# Patient Record
Sex: Female | Born: 1965 | Race: White | Hispanic: No | Smoking: Never smoker
Health system: Southern US, Community
[De-identification: ages and names within clinical notes are randomized; demographics above are authoritative.]

## PROBLEM LIST (undated history)

## (undated) DIAGNOSIS — M329 Systemic lupus erythematosus, unspecified: Secondary | ICD-10-CM

## (undated) DIAGNOSIS — M797 Fibromyalgia: Secondary | ICD-10-CM

## (undated) HISTORY — DX: Fibromyalgia: M79.7

## (undated) HISTORY — PX: ROUX-EN-Y GASTRIC BYPASS: SHX1104

## (undated) HISTORY — PX: NECK SURGERY: SHX720

## (undated) HISTORY — PX: CHOLECYSTECTOMY: SHX55

## (undated) HISTORY — DX: Systemic lupus erythematosus, unspecified: M32.9

## (undated) HISTORY — PX: ABDOMINAL HYSTERECTOMY: SHX81

## (undated) HISTORY — PX: APPENDECTOMY: SHX54

## (undated) HISTORY — PX: BACK SURGERY: SHX140

## (undated) HISTORY — PX: ANKLE SURGERY: SHX546

---

## 2001-11-24 ENCOUNTER — Encounter: Payer: Self-pay | Admitting: Emergency Medicine

## 2001-11-24 ENCOUNTER — Emergency Department (HOSPITAL_COMMUNITY): Admission: EM | Admit: 2001-11-24 | Discharge: 2001-11-24 | Payer: Self-pay | Admitting: Emergency Medicine

## 2004-10-28 ENCOUNTER — Emergency Department: Payer: Self-pay | Admitting: Emergency Medicine

## 2004-11-17 ENCOUNTER — Emergency Department: Payer: Self-pay | Admitting: Emergency Medicine

## 2005-02-14 ENCOUNTER — Emergency Department: Payer: Self-pay | Admitting: Emergency Medicine

## 2006-05-21 ENCOUNTER — Other Ambulatory Visit: Payer: Self-pay

## 2006-05-21 ENCOUNTER — Emergency Department: Payer: Self-pay | Admitting: Emergency Medicine

## 2006-05-26 ENCOUNTER — Emergency Department: Payer: Self-pay | Admitting: Emergency Medicine

## 2006-07-28 ENCOUNTER — Emergency Department: Payer: Self-pay | Admitting: Internal Medicine

## 2007-02-02 ENCOUNTER — Other Ambulatory Visit: Payer: Self-pay

## 2007-02-02 ENCOUNTER — Emergency Department: Payer: Self-pay | Admitting: Emergency Medicine

## 2007-08-17 ENCOUNTER — Emergency Department: Payer: Self-pay | Admitting: Emergency Medicine

## 2008-08-16 ENCOUNTER — Emergency Department: Payer: Self-pay | Admitting: Emergency Medicine

## 2008-12-26 ENCOUNTER — Emergency Department (HOSPITAL_COMMUNITY): Admission: EM | Admit: 2008-12-26 | Discharge: 2008-12-26 | Payer: Self-pay | Admitting: Emergency Medicine

## 2009-03-28 ENCOUNTER — Emergency Department (HOSPITAL_COMMUNITY): Admission: EM | Admit: 2009-03-28 | Discharge: 2009-03-28 | Payer: Self-pay | Admitting: Emergency Medicine

## 2009-03-31 ENCOUNTER — Emergency Department (HOSPITAL_COMMUNITY): Admission: EM | Admit: 2009-03-31 | Discharge: 2009-03-31 | Payer: Self-pay | Admitting: *Deleted

## 2009-06-27 ENCOUNTER — Emergency Department (HOSPITAL_COMMUNITY): Admission: EM | Admit: 2009-06-27 | Discharge: 2009-06-27 | Payer: Self-pay | Admitting: Emergency Medicine

## 2009-08-30 ENCOUNTER — Emergency Department (HOSPITAL_COMMUNITY): Admission: EM | Admit: 2009-08-30 | Discharge: 2009-08-31 | Payer: Self-pay | Admitting: Emergency Medicine

## 2009-09-16 ENCOUNTER — Emergency Department: Payer: Self-pay | Admitting: Emergency Medicine

## 2009-09-23 ENCOUNTER — Emergency Department: Payer: Self-pay | Admitting: Emergency Medicine

## 2009-10-02 ENCOUNTER — Emergency Department (HOSPITAL_COMMUNITY): Admission: EM | Admit: 2009-10-02 | Discharge: 2009-10-02 | Payer: Self-pay | Admitting: Emergency Medicine

## 2010-01-09 ENCOUNTER — Emergency Department: Payer: Self-pay | Admitting: Internal Medicine

## 2010-02-15 ENCOUNTER — Inpatient Hospital Stay (HOSPITAL_COMMUNITY): Admission: EM | Admit: 2010-02-15 | Discharge: 2010-02-19 | Payer: Self-pay | Admitting: Emergency Medicine

## 2010-04-05 ENCOUNTER — Emergency Department (HOSPITAL_COMMUNITY)
Admission: EM | Admit: 2010-04-05 | Discharge: 2010-04-05 | Payer: Self-pay | Source: Home / Self Care | Admitting: Emergency Medicine

## 2010-04-29 IMAGING — CR DG ANKLE COMPLETE 3+V*L*
3 series · 3 of 3 positions shown · non-contrast
Comparison: None

CLINICAL DATA: Fall, pain.

LEFT ANKLE COMPLETE - 3+ VIEW

[t ankle joint ap left]
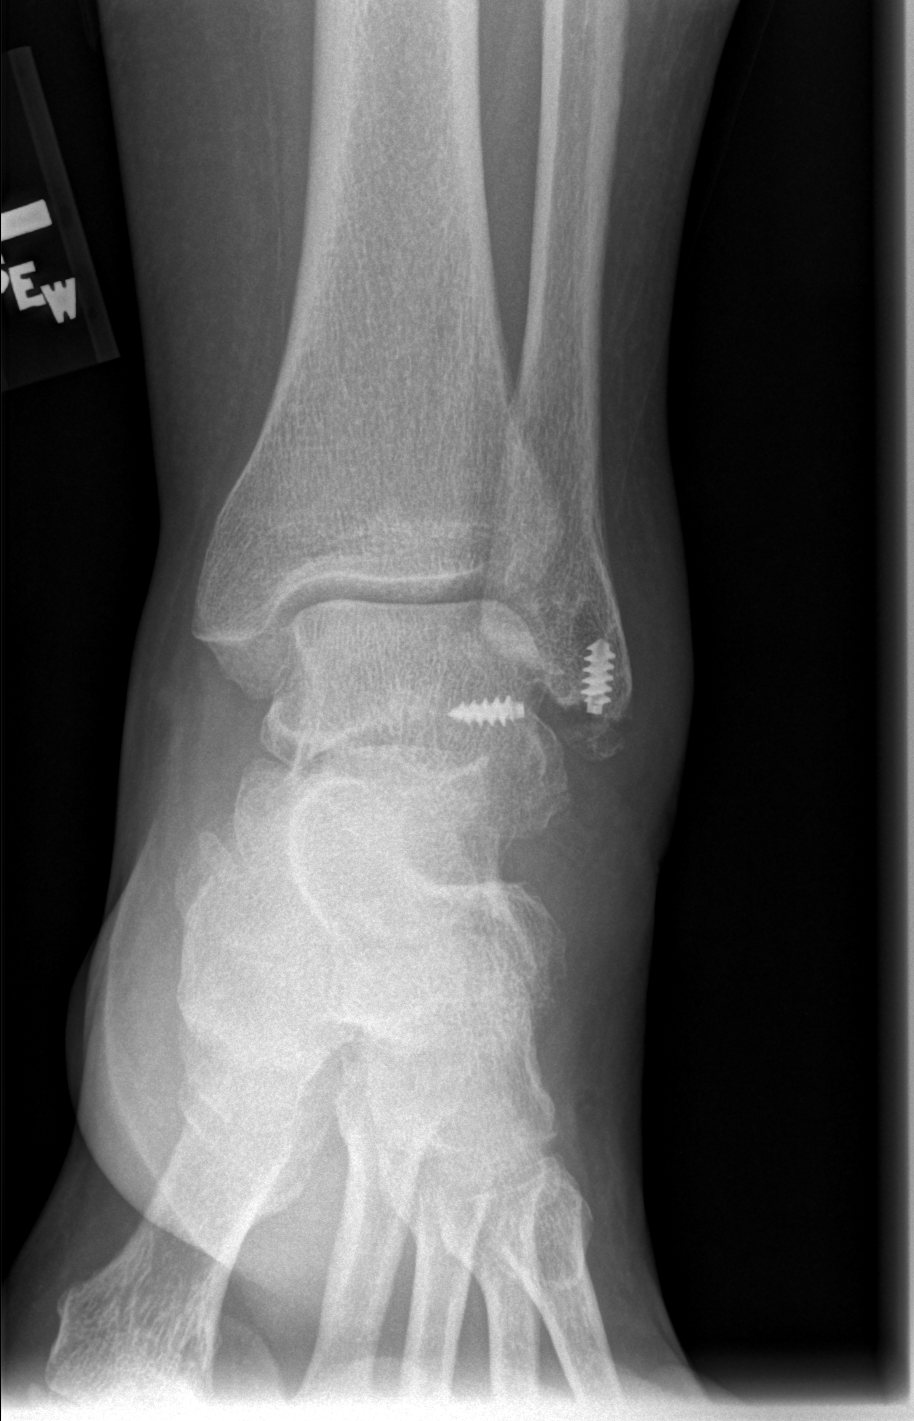

[t ankle joint oblique left]
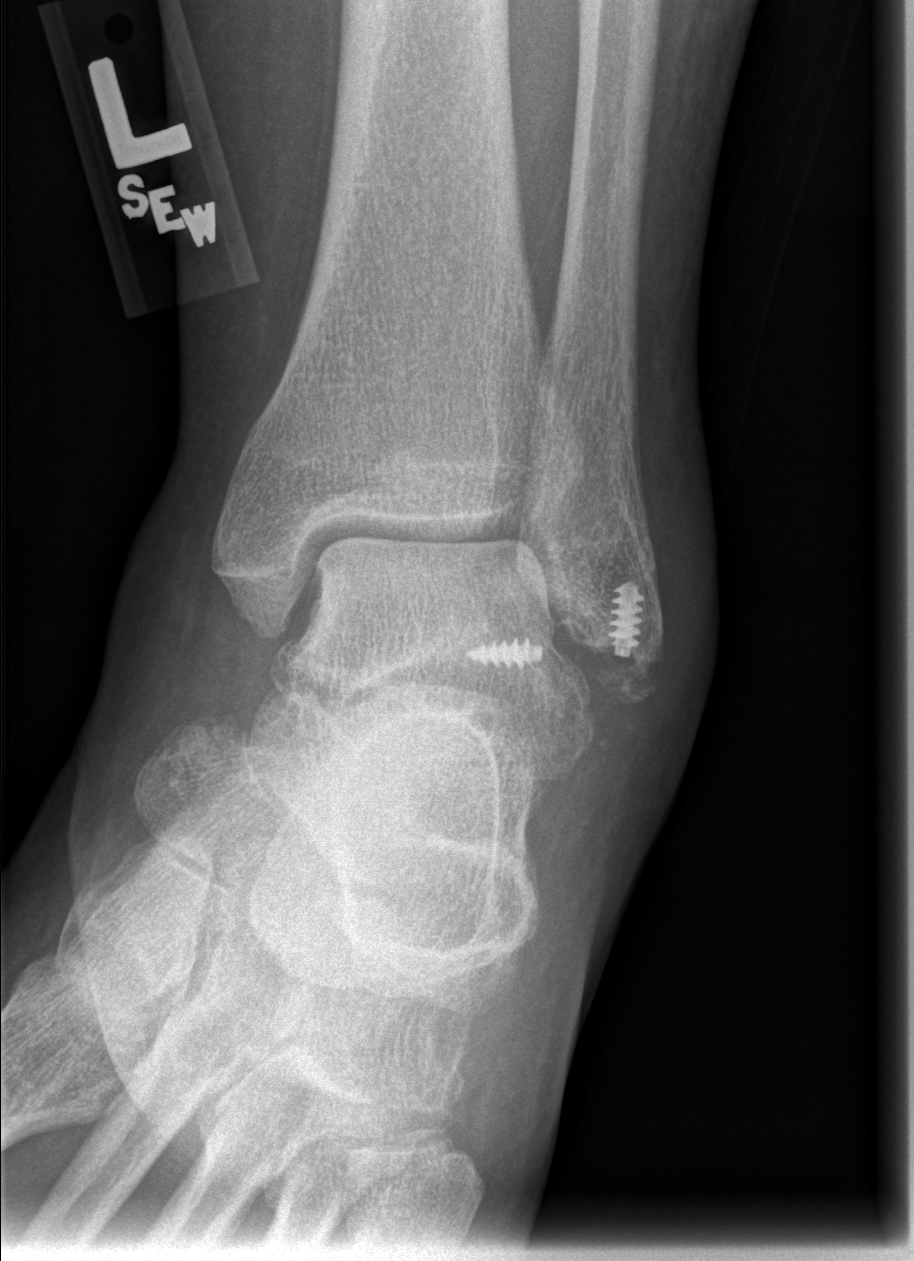

[t ankle joint lat left]
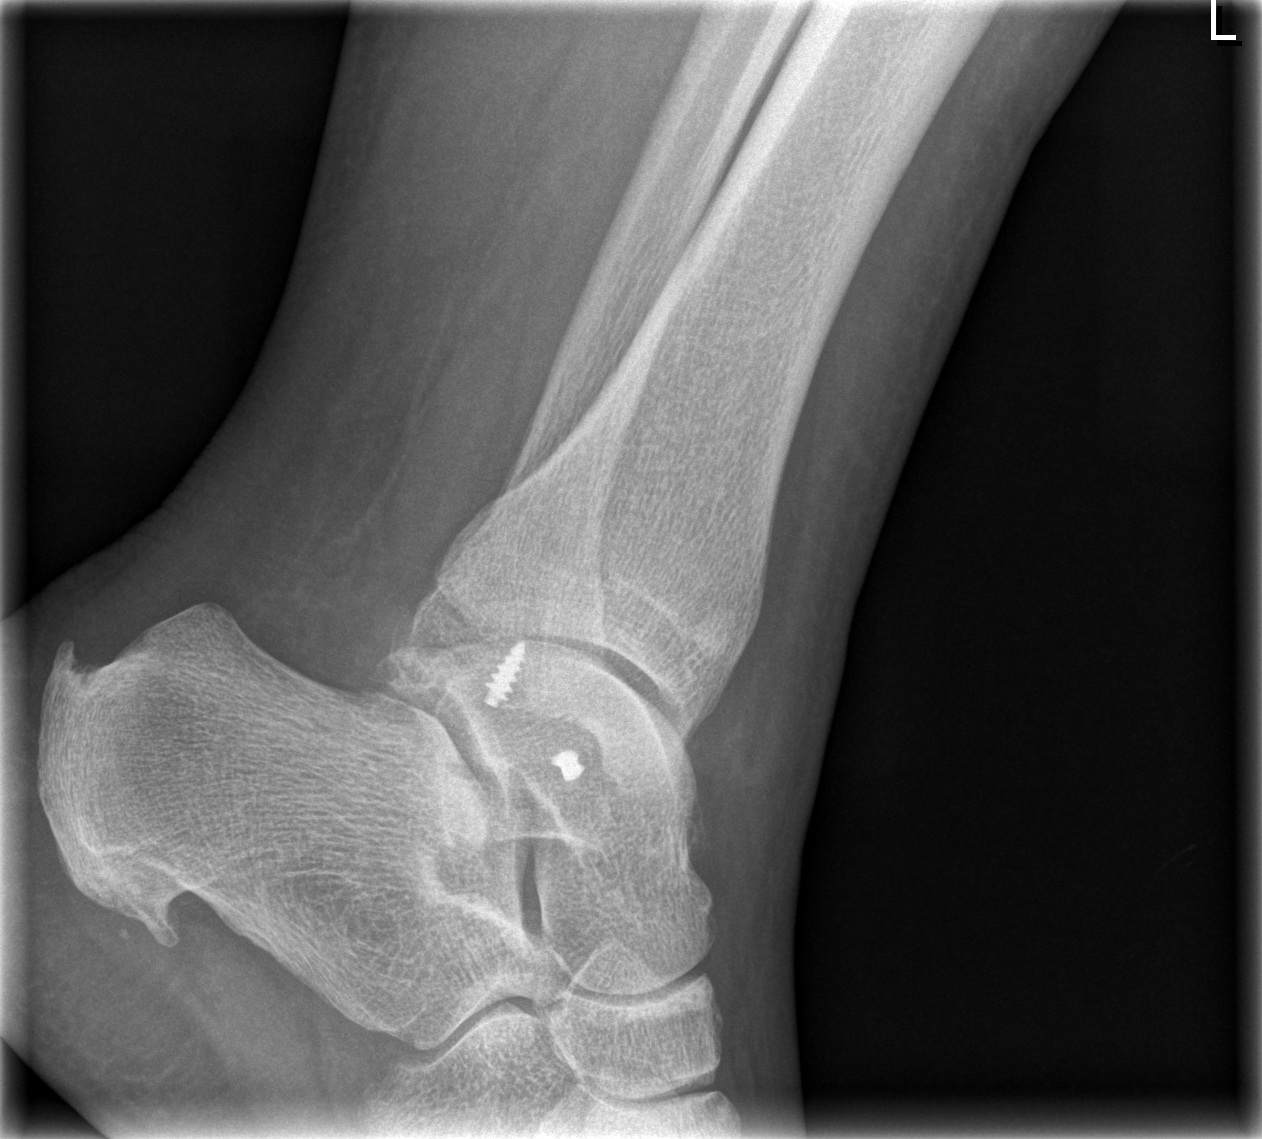

[3 of 3 positions shown; findings below may reference images not displayed]

FINDINGS: Postoperative changes noted in the region of the lateral
malleolus and lateral talus.  Lateral soft tissue swelling noted.
Bone fragments along the tip of the lateral malleolus are noted.
It is difficult to know if this is related to old injury or acute
avulsion.
IMPRESSION: Bone fragments along the tip of the lateral malleolus with
postoperative changes.  I favor this is related to old injury.  It
is difficult to completely exclude acute avulsion off the tip of
the lateral malleolus.  Recommend clinical correlation.

## 2010-05-14 IMAGING — CR DG KNEE COMPLETE 4+V*L*
4 series · 4 of 4 positions shown · non-contrast
Comparison: None

CLINICAL DATA: Fall, knee pain.

LEFT KNEE - COMPLETE 4+ VIEW

[t knee ap right]
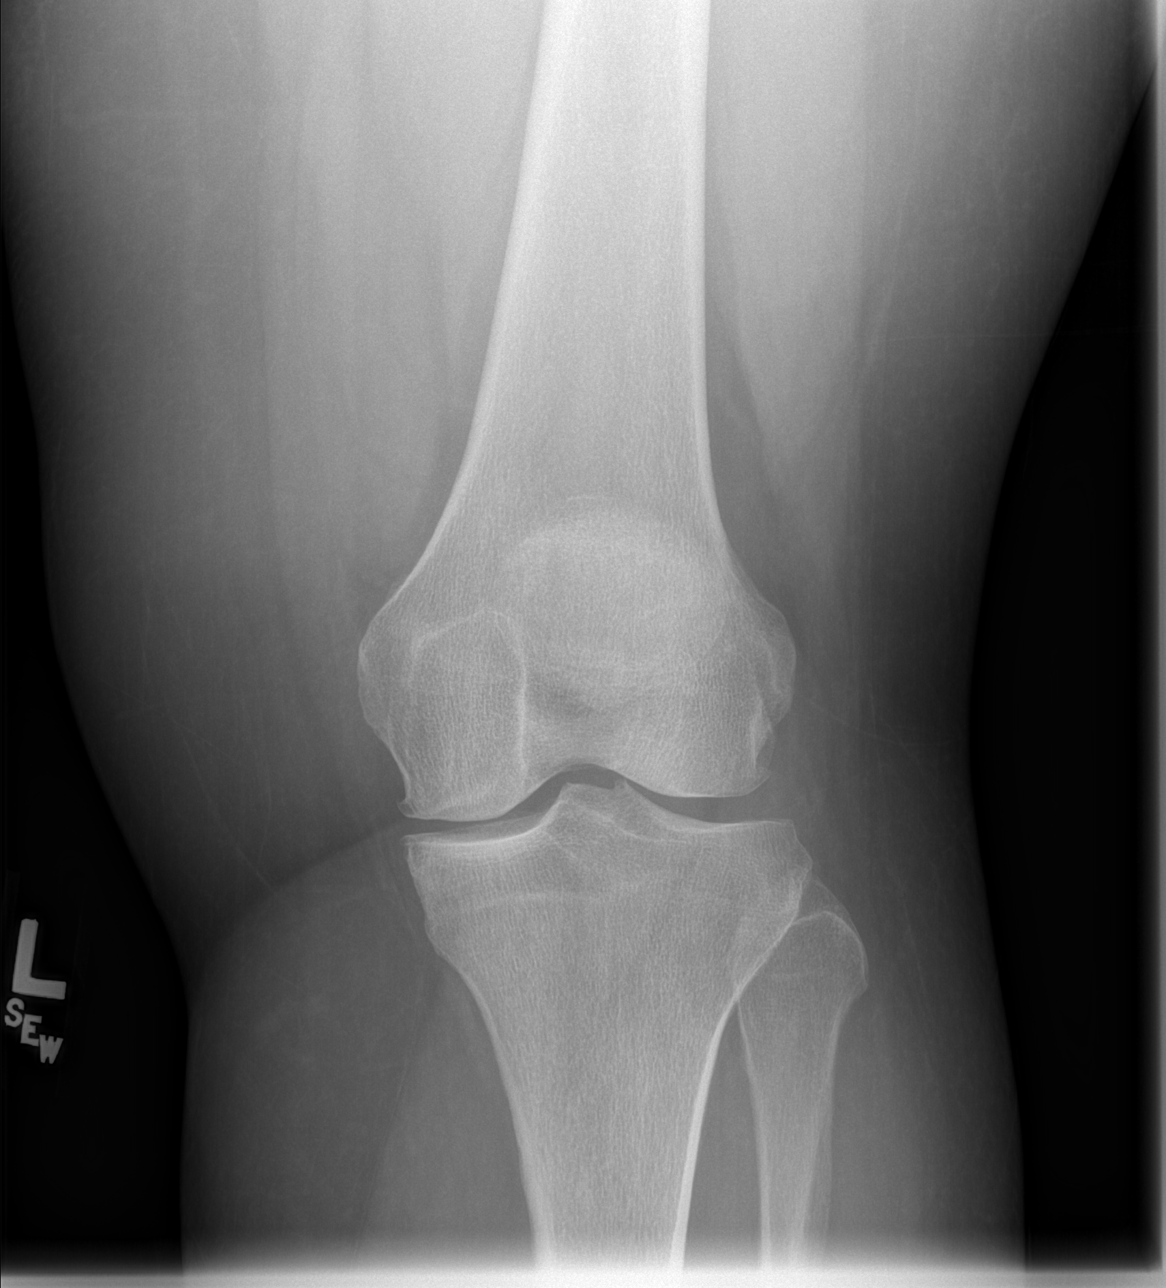

[t knee oblique right (1 of 2)]
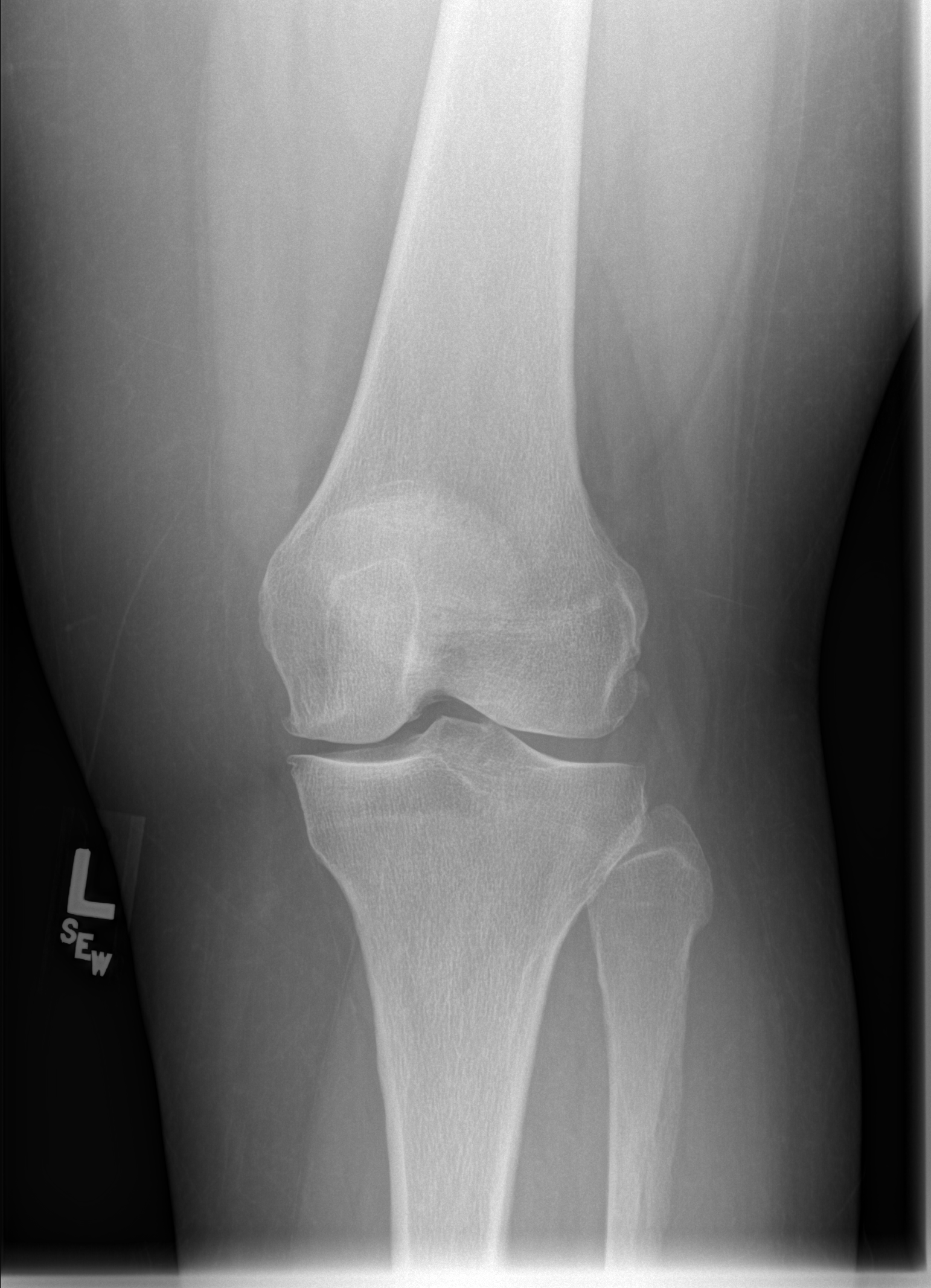

[t knee oblique right (2 of 2)]
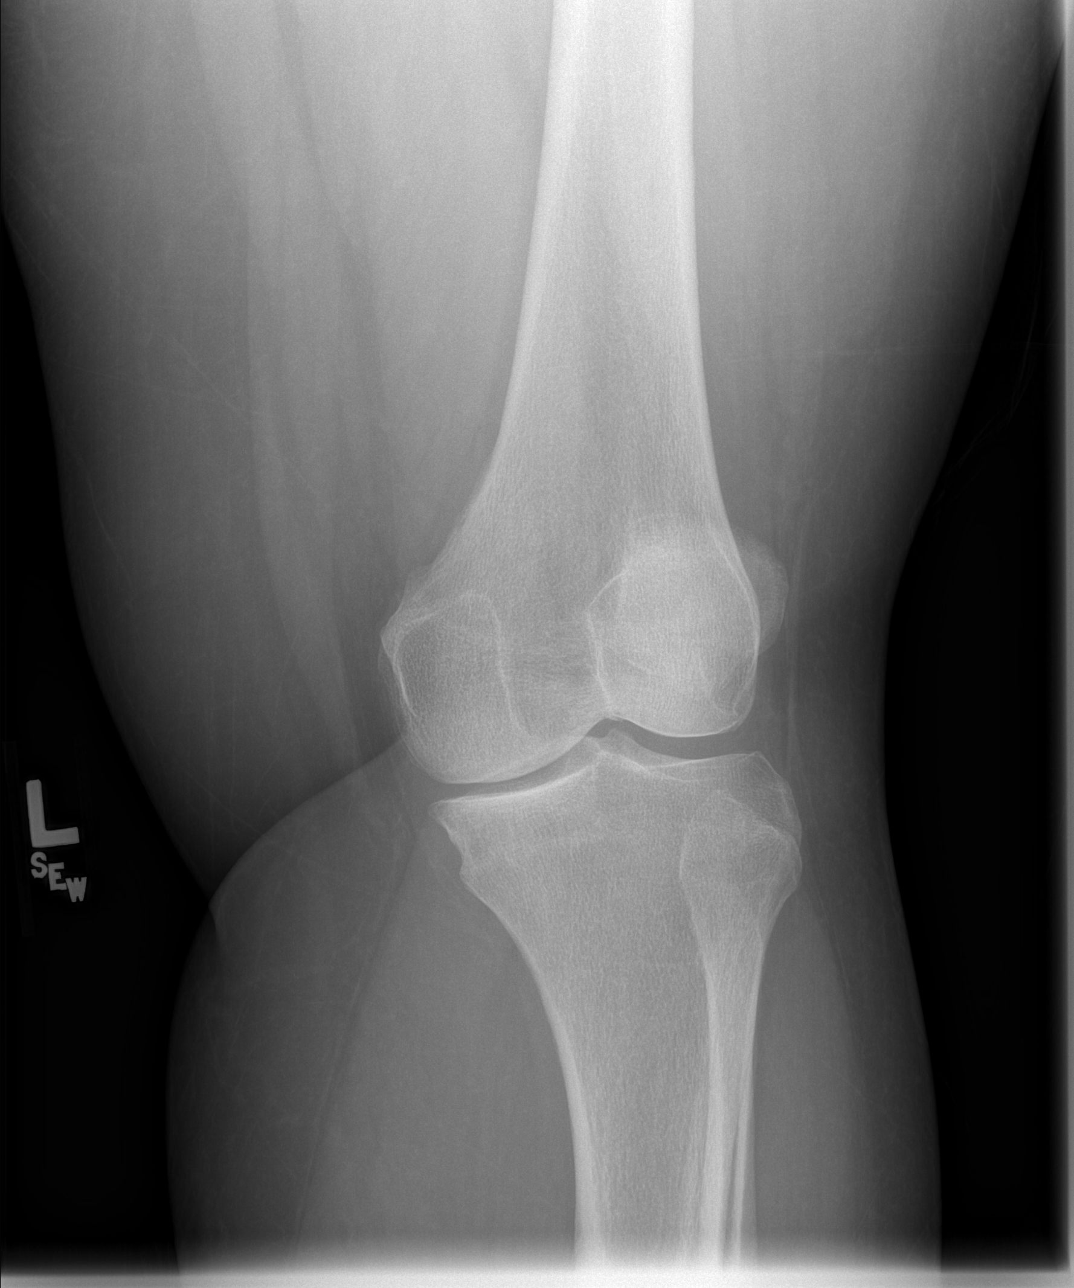

[t knee lat right]
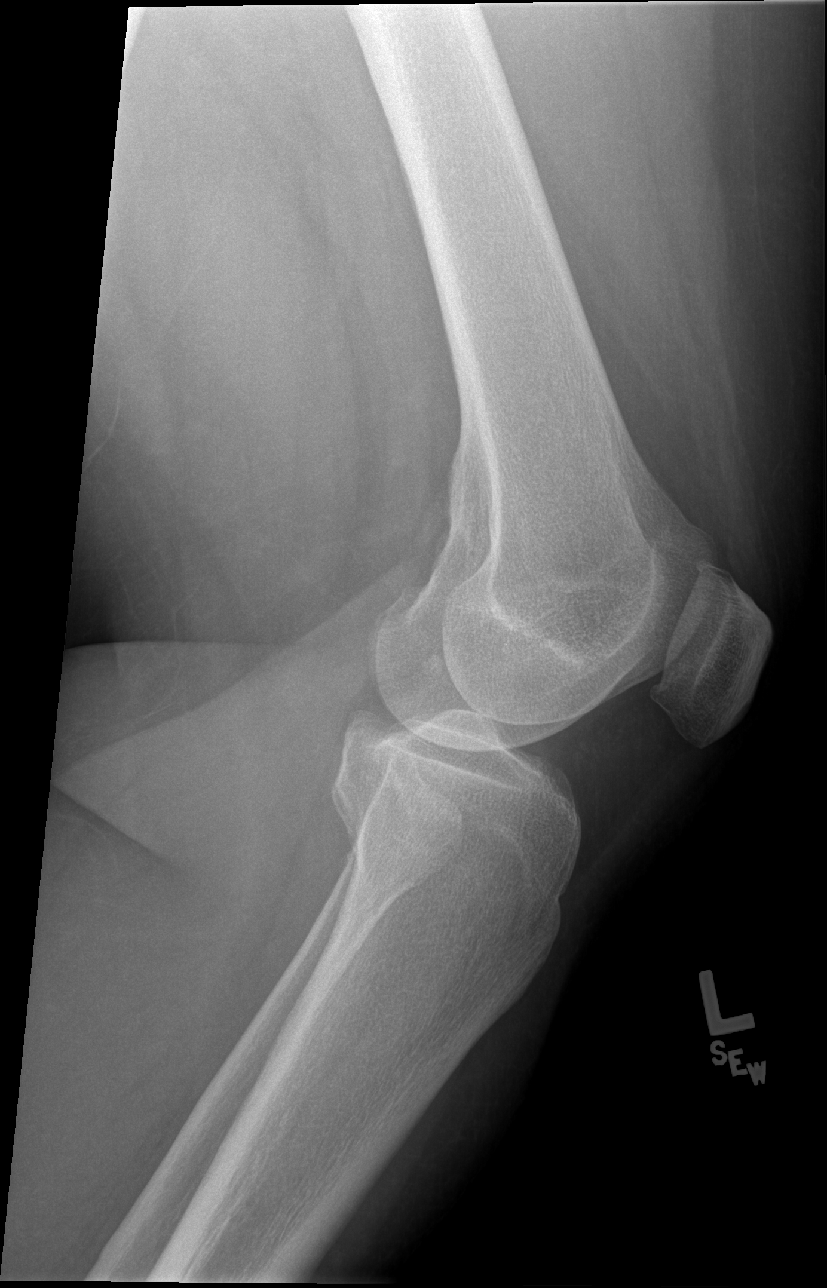

[4 of 4 positions shown; findings below may reference images not displayed]

FINDINGS: Mild degenerative changes in the left knee. No acute bony
abnormality.  Specifically, no fracture, subluxation, or
dislocation.  Soft tissues are intact. No joint effusion.
IMPRESSION: No acute bony abnormality.

## 2010-07-17 ENCOUNTER — Encounter (INDEPENDENT_AMBULATORY_CARE_PROVIDER_SITE_OTHER): Payer: Self-pay | Admitting: Internal Medicine

## 2010-11-11 ENCOUNTER — Inpatient Hospital Stay (HOSPITAL_COMMUNITY): Admission: EM | Admit: 2010-11-11 | Discharge: 2010-07-17 | Payer: Self-pay | Admitting: Emergency Medicine

## 2011-02-18 LAB — DIFFERENTIAL
Basophils Absolute: 0 10*3/uL (ref 0.0–0.1)
Eosinophils Absolute: 0.2 10*3/uL (ref 0.0–0.7)
Eosinophils Relative: 2 % (ref 0–5)
Lymphs Abs: 4 10*3/uL (ref 0.7–4.0)
Monocytes Relative: 6 % (ref 3–12)
Neutrophils Relative %: 63 % (ref 43–77)

## 2011-02-18 LAB — CK TOTAL AND CKMB (NOT AT ARMC)
CK, MB: 0.9 ng/mL (ref 0.3–4.0)
CK, MB: 1.1 ng/mL (ref 0.3–4.0)
Relative Index: 1 (ref 0.0–2.5)
Relative Index: INVALID (ref 0.0–2.5)
Total CK: 107 U/L (ref 7–177)

## 2011-02-18 LAB — GLUCOSE, CAPILLARY
Glucose-Capillary: 91 mg/dL (ref 70–99)
Glucose-Capillary: 93 mg/dL (ref 70–99)

## 2011-02-18 LAB — CBC
HCT: 39.1 % (ref 36.0–46.0)
Hemoglobin: 13 g/dL (ref 12.0–15.0)
MCV: 87.5 fL (ref 78.0–100.0)
Platelets: 317 10*3/uL (ref 150–400)
RBC: 4.47 MIL/uL (ref 3.87–5.11)
RDW: 14.9 % (ref 11.5–15.5)

## 2011-02-18 LAB — CARDIAC PANEL(CRET KIN+CKTOT+MB+TROPI)
CK, MB: 0.7 ng/mL (ref 0.3–4.0)
CK, MB: 0.8 ng/mL (ref 0.3–4.0)
Relative Index: INVALID (ref 0.0–2.5)
Total CK: 80 U/L (ref 7–177)
Troponin I: <0.01 ng/mL (ref 0.00–0.06)

## 2011-02-18 LAB — TROPONIN I: Troponin I: <0.01 ng/mL (ref 0.00–0.06)

## 2011-02-18 LAB — BASIC METABOLIC PANEL
Calcium: 8.8 mg/dL (ref 8.4–10.5)
Potassium: 4 mEq/L (ref 3.5–5.1)

## 2011-02-18 LAB — APTT: aPTT: 26 seconds (ref 24–37)

## 2011-02-22 LAB — TSH: TSH: 1.188 u[IU]/mL (ref 0.350–4.500)

## 2011-02-22 LAB — POCT I-STAT, CHEM 8
Chloride: 103 mEq/L (ref 96–112)
HCT: 43 % (ref 36.0–46.0)
Hemoglobin: 14.6 g/dL (ref 12.0–15.0)
Potassium: 4.3 mEq/L (ref 3.5–5.1)

## 2011-02-28 LAB — CBC
HCT: 36.2 % (ref 36.0–46.0)
Hemoglobin: 11.6 g/dL — ABNORMAL LOW (ref 12.0–15.0)
Hemoglobin: 12 g/dL (ref 12.0–15.0)
Hemoglobin: 12.7 g/dL (ref 12.0–15.0)
MCHC: 32.5 g/dL (ref 30.0–36.0)
MCHC: 33.4 g/dL (ref 30.0–36.0)
MCV: 89.3 fL (ref 78.0–100.0)
MCV: 89.9 fL (ref 78.0–100.0)
MCV: 90.2 fL (ref 78.0–100.0)
Platelets: 306 10*3/uL (ref 150–400)
Platelets: 331 10*3/uL (ref 150–400)
RBC: 3.96 MIL/uL (ref 3.87–5.11)
RBC: 4.27 MIL/uL (ref 3.87–5.11)
RBC: 4.62 MIL/uL (ref 3.87–5.11)
WBC: 10.1 10*3/uL (ref 4.0–10.5)
WBC: 12.1 10*3/uL — ABNORMAL HIGH (ref 4.0–10.5)
WBC: 14.3 10*3/uL — ABNORMAL HIGH (ref 4.0–10.5)
WBC: 9.6 10*3/uL (ref 4.0–10.5)

## 2011-02-28 LAB — BASIC METABOLIC PANEL
BUN: 8 mg/dL (ref 6–23)
CO2: 30 mEq/L (ref 19–32)
CO2: 32 mEq/L (ref 19–32)
Calcium: 8.4 mg/dL (ref 8.4–10.5)
Calcium: 9.1 mg/dL (ref 8.4–10.5)
Chloride: 102 mEq/L (ref 96–112)
Chloride: 107 mEq/L (ref 96–112)
Creatinine, Ser: 0.71 mg/dL (ref 0.4–1.2)
GFR calc Af Amer: >60 mL/min (ref >60)
GFR calc Af Amer: >60 mL/min (ref >60)
GFR calc non Af Amer: >60 mL/min (ref >60)
GFR calc non Af Amer: >60 mL/min (ref >60)
Glucose, Bld: 107 mg/dL — ABNORMAL HIGH (ref 70–99)
Glucose, Bld: 99 mg/dL (ref 70–99)
Potassium: 4.2 mEq/L (ref 3.5–5.1)
Potassium: 4.5 mEq/L (ref 3.5–5.1)
Sodium: 141 mEq/L (ref 135–145)
Sodium: 144 mEq/L (ref 135–145)

## 2011-02-28 LAB — URINALYSIS, ROUTINE W REFLEX MICROSCOPIC
Bilirubin Urine: NEGATIVE
Glucose, UA: NEGATIVE mg/dL
Hgb urine dipstick: NEGATIVE
Ketones, ur: NEGATIVE mg/dL
Protein, ur: NEGATIVE mg/dL

## 2011-02-28 LAB — GLUCOSE, CAPILLARY
Glucose-Capillary: 110 mg/dL — ABNORMAL HIGH (ref 70–99)
Glucose-Capillary: 115 mg/dL — ABNORMAL HIGH (ref 70–99)
Glucose-Capillary: 92 mg/dL (ref 70–99)

## 2011-02-28 LAB — POCT I-STAT, CHEM 8
BUN: 6 mg/dL (ref 6–23)
Calcium, Ion: 1.04 mmol/L — ABNORMAL LOW (ref 1.12–1.32)
Creatinine, Ser: 0.7 mg/dL (ref 0.4–1.2)
TCO2: 28 mmol/L (ref 0–100)

## 2011-02-28 LAB — DIFFERENTIAL
Basophils Absolute: 0.1 10*3/uL (ref 0.0–0.1)
Eosinophils Relative: 2 % (ref 0–5)
Lymphs Abs: 3.4 10*3/uL (ref 0.7–4.0)
Monocytes Relative: 4 % (ref 3–12)
Neutro Abs: 8 10*3/uL — ABNORMAL HIGH (ref 1.7–7.7)
Neutrophils Relative %: 66 % (ref 43–77)

## 2011-02-28 LAB — RAPID URINE DRUG SCREEN, HOSP PERFORMED
Amphetamines: NOT DETECTED
Benzodiazepines: POSITIVE — AB

## 2011-02-28 LAB — RPR: RPR Ser Ql: NONREACTIVE

## 2011-03-10 LAB — URINALYSIS, ROUTINE W REFLEX MICROSCOPIC
Glucose, UA: NEGATIVE mg/dL
Hgb urine dipstick: NEGATIVE
Specific Gravity, Urine: 1.015 (ref 1.005–1.030)
Urobilinogen, UA: 1 mg/dL (ref 0.0–1.0)

## 2011-03-10 LAB — POCT I-STAT, CHEM 8
BUN: 12 mg/dL (ref 6–23)
Creatinine, Ser: 0.8 mg/dL (ref 0.4–1.2)
Hemoglobin: 13.9 g/dL (ref 12.0–15.0)
Potassium: 4.4 mEq/L (ref 3.5–5.1)
Sodium: 140 mEq/L (ref 135–145)

## 2011-03-13 LAB — BASIC METABOLIC PANEL
CO2: 28 mEq/L (ref 19–32)
Calcium: 9.1 mg/dL (ref 8.4–10.5)
GFR calc Af Amer: >60 mL/min (ref >60)
Sodium: 143 mEq/L (ref 135–145)

## 2011-03-13 LAB — CARBOXYHEMOGLOBIN
Carboxyhemoglobin: 1.5 % (ref 0.5–1.5)
Methemoglobin: 0.6 % (ref 0.0–1.5)

## 2011-03-13 LAB — DIFFERENTIAL
Basophils Relative: 1 % (ref 0–1)
Lymphocytes Relative: 22 % (ref 12–46)
Lymphs Abs: 2.8 10*3/uL (ref 0.7–4.0)
Monocytes Absolute: 0.7 10*3/uL (ref 0.1–1.0)
Monocytes Relative: 5 % (ref 3–12)
Neutro Abs: 9.1 10*3/uL — ABNORMAL HIGH (ref 1.7–7.7)

## 2011-03-13 LAB — CBC
Hemoglobin: 13.2 g/dL (ref 12.0–15.0)
MCHC: 32.7 g/dL (ref 30.0–36.0)
RBC: 4.53 MIL/uL (ref 3.87–5.11)

## 2011-03-16 LAB — COMPREHENSIVE METABOLIC PANEL
ALT: 50 U/L — ABNORMAL HIGH (ref 0–35)
AST: 38 U/L — ABNORMAL HIGH (ref 0–37)
Albumin: 3.4 g/dL — ABNORMAL LOW (ref 3.5–5.2)
Alkaline Phosphatase: 128 U/L — ABNORMAL HIGH (ref 39–117)
CO2: 30 mEq/L (ref 19–32)
Chloride: 100 mEq/L (ref 96–112)
Creatinine, Ser: 0.93 mg/dL (ref 0.4–1.2)
GFR calc Af Amer: >60 mL/min (ref >60)
GFR calc non Af Amer: >60 mL/min (ref >60)
Potassium: 4.2 mEq/L (ref 3.5–5.1)
Sodium: 138 mEq/L (ref 135–145)
Total Bilirubin: 0.3 mg/dL (ref 0.3–1.2)

## 2011-03-16 LAB — URINALYSIS, ROUTINE W REFLEX MICROSCOPIC
Bilirubin Urine: NEGATIVE
Bilirubin Urine: NEGATIVE
Glucose, UA: NEGATIVE mg/dL
Hgb urine dipstick: NEGATIVE
Ketones, ur: NEGATIVE mg/dL
Nitrite: NEGATIVE
Protein, ur: NEGATIVE mg/dL
Specific Gravity, Urine: 1.007 (ref 1.005–1.030)
pH: 6.5 (ref 5.0–8.0)
pH: 7.5 (ref 5.0–8.0)

## 2011-03-16 LAB — POCT PREGNANCY, URINE
Preg Test, Ur: NEGATIVE
Preg Test, Ur: NEGATIVE

## 2011-03-16 LAB — DIFFERENTIAL
Basophils Absolute: 0.1 10*3/uL (ref 0.0–0.1)
Eosinophils Absolute: 0.2 10*3/uL (ref 0.0–0.7)
Eosinophils Relative: 2 % (ref 0–5)
Eosinophils Relative: 2 % (ref 0–5)
Lymphocytes Relative: 25 % (ref 12–46)
Lymphocytes Relative: 28 % (ref 12–46)
Lymphs Abs: 2.9 10*3/uL (ref 0.7–4.0)
Monocytes Absolute: 0.6 10*3/uL (ref 0.1–1.0)
Monocytes Absolute: 0.8 10*3/uL (ref 0.1–1.0)
Neutro Abs: 7.6 10*3/uL (ref 1.7–7.7)

## 2011-03-16 LAB — URINE CULTURE
Colony Count: 8000
Colony Count: NO GROWTH

## 2011-03-16 LAB — POCT CARDIAC MARKERS
Myoglobin, poc: 59.6 ng/mL (ref 12–200)
Troponin i, poc: <0.05 ng/mL (ref 0.00–0.09)

## 2011-03-16 LAB — CBC
HCT: 38.5 % (ref 36.0–46.0)
Hemoglobin: 13.1 g/dL (ref 12.0–15.0)
MCV: 90.3 fL (ref 78.0–100.0)
Platelets: 286 10*3/uL (ref 150–400)
RBC: 4.3 MIL/uL (ref 3.87–5.11)
RBC: 4.36 MIL/uL (ref 3.87–5.11)
WBC: 10.9 10*3/uL — ABNORMAL HIGH (ref 4.0–10.5)
WBC: 11.6 10*3/uL — ABNORMAL HIGH (ref 4.0–10.5)

## 2011-03-16 LAB — BASIC METABOLIC PANEL
GFR calc Af Amer: >60 mL/min (ref >60)
GFR calc non Af Amer: >60 mL/min (ref >60)
Potassium: 5 mEq/L (ref 3.5–5.1)
Sodium: 135 mEq/L (ref 135–145)

## 2011-03-16 LAB — RAPID URINE DRUG SCREEN, HOSP PERFORMED
Amphetamines: NOT DETECTED
Benzodiazepines: POSITIVE — AB
Cocaine: NOT DETECTED

## 2011-03-16 LAB — HEPATIC FUNCTION PANEL
ALT: 43 U/L — ABNORMAL HIGH (ref 0–35)
AST: 35 U/L (ref 0–37)
Alkaline Phosphatase: 123 U/L — ABNORMAL HIGH (ref 39–117)
Bilirubin, Direct: <0.1 mg/dL (ref 0.0–0.3)

## 2011-03-16 LAB — POCT I-STAT, CHEM 8
BUN: 13 mg/dL (ref 6–23)
Calcium, Ion: 1.1 mmol/L — ABNORMAL LOW (ref 1.12–1.32)
Creatinine, Ser: 1 mg/dL (ref 0.4–1.2)
Hemoglobin: 13.6 g/dL (ref 12.0–15.0)
Sodium: 138 mEq/L (ref 135–145)
TCO2: 30 mmol/L (ref 0–100)

## 2011-03-16 LAB — CK TOTAL AND CKMB (NOT AT ARMC)
Relative Index: INVALID (ref 0.0–2.5)
Total CK: 78 U/L (ref 7–177)

## 2011-03-21 LAB — POCT I-STAT, CHEM 8
Creatinine, Ser: 0.9 mg/dL (ref 0.4–1.2)
Glucose, Bld: 93 mg/dL (ref 70–99)
HCT: 36 % (ref 36.0–46.0)
Hemoglobin: 12.2 g/dL (ref 12.0–15.0)
Potassium: 3.8 mEq/L (ref 3.5–5.1)
Sodium: 141 mEq/L (ref 135–145)
TCO2: 28 mmol/L (ref 0–100)

## 2011-04-22 NOTE — Discharge Summary (Signed)
NAMEARBADELLA, KIMBLER                 ACCOUNT NO.:  [id number]   MEDICAL RECORD NO.:  [id number]          PATIENT TYPE:  EMS   LOCATION:  [facility]                         FACILITY:  [facility]   PHYSICIAN:  Fransisco Hertz, M.D.  DATE OF BIRTH:  1966-02-20   DATE OF ADMISSION:  03/31/2009  DATE OF DISCHARGE:  03/31/2009                               DISCHARGE SUMMARY   Please note that the patient left AMA and a full thorough evaluation was  not able to be undertaken as the patient left shortly after being  admitted from the hospital.   DISCHARGE DIAGNOSES:  1. Syncopal episode of unknown etiology.  2. History of fibromyalgia with an acute exacerbation.  3. Hypertension.  4. Hyperlipidemia.  5. Hypothyroidism.  6. Recent foot surgery on the left ankle in January 2010 at [facility]      [facility] [place].  7. History of appendectomy, hysterectomy, thyroid surgery, and      cholecystectomy.   DISCHARGE MEDICATIONS:  Please note again that the patient left AMA, and  the patient left with the same medications that she came in on and these  include the following:  1. P.o. Xanax 2 mg p.r.n.  2. P.o. aspirin 81 mg daily.  3. P.o. cyclobenzaprine 10 mg p.r.n.  4. P.o. enalapril 5 mg daily.  5. P.o. Neurontin 600 mg t.i.d.  6. P.o. Vicodin 5/325 p.r.n.  7. P.o. Synthroid 550 mcg daily.  8. Wellbutrin SR 150 mg daily.  9. P.o. Cymbalta 30 mg t.i.d.  10.P.o. Lipitor 20 mg daily.  11.P.o. omeprazole 40 mg daily.   CONDITION ON DISCHARGE:  Again, the patient left AMA, was not able to be  fully evaluated by the medical staff.  For this reason, we are unable to  ascertain the patient's discharge condition.  She had an apparent  syncopal episode in the ED and fell down and may be at risk for future  falls.   IMAGING:  1. CT of the head without contrast that showed no acute intracranial      abnormality.  2. Chest x-ray, 2 view:  No active disease.  3. Left shoulder x-ray with the following  impression:  No acute      findings.  4. Ankle x-ray of the left ankle:  Bone fragments along the tip of the      lateral malleolus with postoperative changes.  I favor this is      related to old injury.  It is difficult to completely exclude acute      avulsion of tip of the lateral malleolus.  Recommend clinical      correlation.  5. Knee x-ray of the left knee:  No acute bony abnormality.   CONSULTATIONS:  None.   HISTORY OF PRESENT ILLNESS:  The patient is a 45 year old female with  fibromyalgia, who presents after syncopal episode x2.  The patient has  had fibromyalgia flare over the past few days after running out of her  medications last week and has had so much pain that she began vomiting  including 6 episodes yesterday, that was  nonbloody and nonbilious.  She  had poor p.o. intake as a result.  Her first fall occurred last night  after she was rising from her wheelchair, she occasionally uses one of  her husband's wheel chairs, who has multiple sclerosis, when she feels  weak.  She reports feeling lightheaded before the episode and having  some post episodic confusion.  She denies any seizure activity or  incontinence, denies any chest pain, but does report  tachycardia/palpitations.  She came to the ED and had another syncopal  episode while in the wheelchair around 1:00 a.m.  The patient reports  having fallen/syncope 3-4 times in the past, most recently around 1 week  ago.  Also, she has fibromyalgia flares.  She has chronic diarrhea but  no changes in it recently.  She has frequent 10/10 pain diffusely.   PHYSICAL EXAMINATION:  VITAL SIGNS:  Temperature 97.3, blood pressure of  123-149 over 73-103, pulse of 81-109, respiratory rate of 12-18, O2 sat  98% on room air.  GENERAL:  In no apparent distress.  HEENT:  Eyes, EOMI, anicteric.  ENT, moist oral mucosa.  Tongue midline.  RESPIRATORY:  Clear to auscultation bilaterally.  CARDIOVASCULAR:  Distant heart sounds  secondary to body habitus.  I did  not auscultate a murmur.  GASTROINTESTINAL:  Obese, positive normoactive bowel sounds.  Diffuse  mild tenderness.  EXTREMITIES:  Left ankle in brace.  No cyanosis, clubbing, or edema.  MUSCULOSKELETAL:  Pain with palpation of multiple muscles.  Pain in left  ankle.  NEUROLOGIC:  Alert and oriented x3.  Cranial nerves II through XII  intact.  Conductive right hearing is better than her left.  PSYCH:  Appropriate.   ADMISSION LABORATORY DATA:  Sodium 130, potassium 4.2, chloride of 100,  bicarb of 30, BUN of 11, creatinine of 0.93, glucose of 104.  Bilirubin  of 0.3, alk phos of 128, AST of 38, ALT of 50, protein 7.0, albumin 3.4,  calcium of 9.1.  White blood cell count is 10.9, hemoglobin 12.8,  platelets of 286.  Urine pregnancy test negative.  Urinalysis is notable  for a specific gravity of 1.008, otherwise negative.  Point-of-care  cardiac enzymes were negative x2.   EKG showed normal sinus rhythm.   HOSPITAL COURSE:  1. Syncope:  The patient had 2 episodes of syncope, one at home and      then one in the ED, both in the setting of a fibromyalgia flare      with resultant decrease p.o. intake and nausea and vomiting.  The      specific gravity on her urinalysis may indicate mild dehydration.      Secondary to the patient leaving prematurely, orthostatics were not      able to be obtained.  A full evaluation was not able to be obtained      additionally.  The patient only had 2 sets of point-of-care cardiac      markers and then 1 full set of cardiac markers, all of which were      normal for any signs of coronary ischemia.  The patient was treated      for her pain while in the ED with fentanyl and Toradol.  The      patient responded well, and her pain diminished.  She will need to      have her medications refilled so that she does not suffer from a      fibromyalgia flare that could induce syncope.  Other causes of      syncope were to be  evaluated in the hospital setting.  She was to      be monitored on telemetry, but this was not able to be done as the      patient left prematurely.  2. Transaminitis:  The patient had mildly elevated AST of 38 and ALT      of 50.  The patient did have a hepatitis B surface antigen that was      negative.  At this time, we are not sure of the elevation in      transaminases without a thorough workup.  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      This may warrant further outpatient evaluation.  3. Fibromyalgia:  The patient has severe fibromyalgia with constant      10/10 pain diffusely.  She is currently on a number of outpatient      medications but had run out of them recently, hence she had a      fibromyalgia flare with the resultant syncopal episode that caused      her to be admitted.  4. Hypertension:  The patient had blood pressures of 123-149 and      should continue on her current outpatient medications.   DISCHARGE LABORATORY DATA AND VITAL SIGNS:  Again, the patient left AMA  and these were not able to be obtained.   PENDING LABORATORY DATA:  There were no pending labs at this time.      Linward Foster, MD  Electronically Signed      Fransisco Hertz, M.D.  Electronically Signed    LW/MEDQ  D:  04/02/2009  T:  04/03/2009  Job:  [id number]

## 2012-11-21 ENCOUNTER — Ambulatory Visit: Payer: Self-pay | Admitting: Physical Therapy

## 2012-11-26 ENCOUNTER — Ambulatory Visit: Payer: Medicare Other | Attending: Orthopedic Surgery | Admitting: Physical Therapy

## 2012-11-26 DIAGNOSIS — R262 Difficulty in walking, not elsewhere classified: Secondary | ICD-10-CM | POA: Insufficient documentation

## 2012-11-26 DIAGNOSIS — M25676 Stiffness of unspecified foot, not elsewhere classified: Secondary | ICD-10-CM | POA: Insufficient documentation

## 2012-11-26 DIAGNOSIS — M25673 Stiffness of unspecified ankle, not elsewhere classified: Secondary | ICD-10-CM | POA: Insufficient documentation

## 2012-11-26 DIAGNOSIS — M25579 Pain in unspecified ankle and joints of unspecified foot: Secondary | ICD-10-CM | POA: Insufficient documentation

## 2012-11-26 DIAGNOSIS — IMO0001 Reserved for inherently not codable concepts without codable children: Secondary | ICD-10-CM | POA: Insufficient documentation

## 2012-11-26 DIAGNOSIS — R5381 Other malaise: Secondary | ICD-10-CM | POA: Insufficient documentation

## 2012-11-30 ENCOUNTER — Ambulatory Visit: Payer: Medicare Other | Admitting: *Deleted

## 2012-12-03 ENCOUNTER — Encounter: Payer: Medicare Other | Admitting: *Deleted

## 2012-12-06 ENCOUNTER — Ambulatory Visit: Payer: Medicare Other | Attending: Family Medicine | Admitting: Physical Therapy

## 2012-12-06 DIAGNOSIS — M25676 Stiffness of unspecified foot, not elsewhere classified: Secondary | ICD-10-CM | POA: Insufficient documentation

## 2012-12-06 DIAGNOSIS — M25579 Pain in unspecified ankle and joints of unspecified foot: Secondary | ICD-10-CM | POA: Insufficient documentation

## 2012-12-06 DIAGNOSIS — R262 Difficulty in walking, not elsewhere classified: Secondary | ICD-10-CM | POA: Insufficient documentation

## 2012-12-06 DIAGNOSIS — R5381 Other malaise: Secondary | ICD-10-CM | POA: Insufficient documentation

## 2012-12-06 DIAGNOSIS — M25673 Stiffness of unspecified ankle, not elsewhere classified: Secondary | ICD-10-CM | POA: Insufficient documentation

## 2012-12-06 DIAGNOSIS — IMO0001 Reserved for inherently not codable concepts without codable children: Secondary | ICD-10-CM | POA: Insufficient documentation

## 2012-12-12 ENCOUNTER — Encounter: Payer: Medicare Other | Admitting: Physical Therapy

## 2012-12-14 ENCOUNTER — Encounter: Payer: Medicare Other | Admitting: Physical Therapy

## 2023-06-09 ENCOUNTER — Emergency Department (HOSPITAL_COMMUNITY): Payer: 59

## 2023-06-09 ENCOUNTER — Emergency Department (HOSPITAL_COMMUNITY)
Admission: EM | Admit: 2023-06-09 | Discharge: 2023-06-09 | Disposition: A | Discharge status: Home / Self Care | Payer: 59 | Attending: Emergency Medicine | Admitting: Emergency Medicine

## 2023-06-09 ENCOUNTER — Other Ambulatory Visit: Payer: Self-pay

## 2023-06-09 ENCOUNTER — Encounter (HOSPITAL_COMMUNITY): Payer: Self-pay | Admitting: Emergency Medicine

## 2023-06-09 DIAGNOSIS — S199XXA Unspecified injury of neck, initial encounter: Secondary | ICD-10-CM | POA: Diagnosis present

## 2023-06-09 DIAGNOSIS — S20212A Contusion of left front wall of thorax, initial encounter: Secondary | ICD-10-CM | POA: Diagnosis not present

## 2023-06-09 DIAGNOSIS — Y92410 Unspecified street and highway as the place of occurrence of the external cause: Secondary | ICD-10-CM | POA: Diagnosis not present

## 2023-06-09 DIAGNOSIS — S161XXA Strain of muscle, fascia and tendon at neck level, initial encounter: Secondary | ICD-10-CM | POA: Diagnosis not present

## 2023-06-09 MED ORDER — ACETAMINOPHEN 325 MG PO TABS
650.0000 mg | ORAL_TABLET | Freq: Once | ORAL | Status: AC
  Administered 2023-06-09: 650 mg via ORAL
  Filled 2023-06-09: qty 2

## 2023-06-09 MED ORDER — METHOCARBAMOL 500 MG PO TABS: 500.0000 mg | ORAL_TABLET | Freq: Four times a day (QID) | ORAL | 0 refills | Status: AC | PRN

## 2023-06-09 MED ORDER — LIDOCAINE 5 % EX PTCH: 1.0000 | MEDICATED_PATCH | CUTANEOUS | 0 refills | Status: AC

## 2023-06-09 MED ORDER — METHOCARBAMOL 500 MG PO TABS
500.0000 mg | ORAL_TABLET | Freq: Once | ORAL | Status: AC
  Administered 2023-06-09: 500 mg via ORAL
  Filled 2023-06-09: qty 1
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
